# Patient Record
Sex: Male | Born: 1980 | Race: White | Hispanic: No | Marital: Married | Smoking: Former smoker
Health system: Southern US, Community
[De-identification: ages and names within clinical notes are randomized; demographics above are authoritative.]

## PROBLEM LIST (undated history)

## (undated) DIAGNOSIS — I10 Essential (primary) hypertension: Secondary | ICD-10-CM

## (undated) DIAGNOSIS — R002 Palpitations: Secondary | ICD-10-CM

## (undated) DIAGNOSIS — N200 Calculus of kidney: Secondary | ICD-10-CM

---

## 2011-04-12 ENCOUNTER — Emergency Department (HOSPITAL_COMMUNITY)
Admission: EM | Admit: 2011-04-12 | Discharge: 2011-04-12 | Disposition: A | Discharge status: Home / Self Care | Payer: Self-pay | Attending: Emergency Medicine | Admitting: Emergency Medicine

## 2011-04-12 DIAGNOSIS — N200 Calculus of kidney: Secondary | ICD-10-CM | POA: Insufficient documentation

## 2011-04-12 DIAGNOSIS — R109 Unspecified abdominal pain: Secondary | ICD-10-CM | POA: Insufficient documentation

## 2011-04-12 DIAGNOSIS — R110 Nausea: Secondary | ICD-10-CM | POA: Insufficient documentation

## 2011-04-12 LAB — URINE MICROSCOPIC-ADD ON

## 2011-04-12 LAB — DIFFERENTIAL
Basophils Absolute: 0 10*3/uL (ref 0.0–0.1)
Lymphocytes Relative: 12 % (ref 12–46)
Neutro Abs: 13.1 10*3/uL — ABNORMAL HIGH (ref 1.7–7.7)
Neutrophils Relative %: 82 % — ABNORMAL HIGH (ref 43–77)

## 2011-04-12 LAB — CBC
HCT: 43 % (ref 39.0–52.0)
Hemoglobin: 14.9 g/dL (ref 13.0–17.0)
RBC: 4.72 MIL/uL (ref 4.22–5.81)
WBC: 16 10*3/uL — ABNORMAL HIGH (ref 4.0–10.5)

## 2011-04-12 LAB — URINALYSIS, ROUTINE W REFLEX MICROSCOPIC
Bilirubin Urine: NEGATIVE
Glucose, UA: NEGATIVE mg/dL
Ketones, ur: NEGATIVE mg/dL
Nitrite: NEGATIVE
pH: 5.5 (ref 5.0–8.0)

## 2011-04-12 LAB — COMPREHENSIVE METABOLIC PANEL
Albumin: 4.8 g/dL (ref 3.5–5.2)
Alkaline Phosphatase: 74 U/L (ref 39–117)
BUN: 16 mg/dL (ref 6–23)
Chloride: 102 mEq/L (ref 96–112)
GFR calc Af Amer: >60 mL/min (ref >60)
Glucose, Bld: 107 mg/dL — ABNORMAL HIGH (ref 70–99)
Potassium: 4 mEq/L (ref 3.5–5.1)
Total Bilirubin: 0.3 mg/dL (ref 0.3–1.2)

## 2011-04-14 LAB — URINE CULTURE: Culture: NO GROWTH

## 2011-09-26 ENCOUNTER — Encounter: Payer: Self-pay | Admitting: *Deleted

## 2011-09-26 ENCOUNTER — Emergency Department (HOSPITAL_BASED_OUTPATIENT_CLINIC_OR_DEPARTMENT_OTHER)
Admission: EM | Admit: 2011-09-26 | Discharge: 2011-09-26 | Disposition: A | Discharge status: Home / Self Care | Payer: Self-pay | Attending: Emergency Medicine | Admitting: Emergency Medicine

## 2011-09-26 ENCOUNTER — Emergency Department (INDEPENDENT_AMBULATORY_CARE_PROVIDER_SITE_OTHER): Payer: Self-pay

## 2011-09-26 DIAGNOSIS — B9789 Other viral agents as the cause of diseases classified elsewhere: Secondary | ICD-10-CM | POA: Insufficient documentation

## 2011-09-26 DIAGNOSIS — R05 Cough: Secondary | ICD-10-CM

## 2011-09-26 DIAGNOSIS — I10 Essential (primary) hypertension: Secondary | ICD-10-CM | POA: Insufficient documentation

## 2011-09-26 DIAGNOSIS — B349 Viral infection, unspecified: Secondary | ICD-10-CM

## 2011-09-26 DIAGNOSIS — R079 Chest pain, unspecified: Secondary | ICD-10-CM

## 2011-09-26 HISTORY — DX: Calculus of kidney: N20.0

## 2011-09-26 HISTORY — DX: Palpitations: R00.2

## 2011-09-26 HISTORY — DX: Essential (primary) hypertension: I10

## 2011-09-26 MED ORDER — ALBUTEROL SULFATE HFA 108 (90 BASE) MCG/ACT IN AERS
2.0000 | INHALATION_SPRAY | RESPIRATORY_TRACT | Status: DC | PRN
  Administered 2011-09-26: 2 via RESPIRATORY_TRACT
  Filled 2011-09-26: qty 6.7

## 2011-09-26 NOTE — ED Provider Notes (Signed)
History     CSN: [id number] Arrival date & time: 09/26/2011  9:22 AM   First MD Initiated Contact with Patient 09/26/11 1126      Chief Complaint  Patient presents with  . Cough    (Consider location/radiation/quality/duration/timing/severity/associated sxs/prior treatment) HPI Complains of cough onset 5 days ago with diffuse myalgias and fever. Patient states he was improving until 2 days ago when he began to cough up green mucus with a few drops of blood mixed in today . No other current. Treated with over-the-counter cold medicines with partial relief . Patient's children and wife with similar symptoms  Past Medical History  Diagnosis Date  . Kidney stones   . Palpitations   . Hypertension     History reviewed. No pertinent past surgical history.  No family history on file.  History  Substance Use Topics  . Smoking status: Former Games developer  . Smokeless tobacco: Not on file  . Alcohol Use: No      Review of Systems  Constitutional: Negative.   HENT: Negative.   Respiratory: Positive for cough.   Cardiovascular: Negative.   Gastrointestinal: Negative.   Musculoskeletal: Negative.   Skin: Negative.   Neurological: Negative.   Hematological: Negative.   Psychiatric/Behavioral: Negative.   All other systems reviewed and are negative.    Allergies  Review of patient's allergies indicates no known allergies.  Home Medications   Current Outpatient Rx  Name Route Sig Dispense Refill  . GUAIFENESIN ER 600 MG PO TB12 Oral Take 1,200 mg by mouth 2 (two) times daily.      Marland Kitchen OXYMETAZOLINE HCL 0.05 % NA SOLN Nasal Place 2 sprays into the nose 2 (two) times daily.        BP 146/78  Pulse 80  Temp(Src) 98.1 F (36.7 C) (Oral)  Resp 20  Ht 5\' 10"  (1.778 m)  Wt 226 lb (102.513 kg)  BMI 32.43 kg/m2  SpO2 100%  Physical Exam  Nursing note and vitals reviewed. Constitutional: He appears well-developed and well-nourished.  HENT:  Head: Normocephalic and  atraumatic.  Eyes: Conjunctivae are normal. Pupils are equal, round, and reactive to light.  Neck: Neck supple. No tracheal deviation present. No thyromegaly present.  Cardiovascular: Normal rate and regular rhythm.   No murmur heard. Pulmonary/Chest: Effort normal. No respiratory distress.       Occasional cough, diffuse scattered rhonchi  Abdominal: Soft. Bowel sounds are normal. He exhibits no distension. There is no tenderness.  Musculoskeletal: Normal range of motion. He exhibits no edema and no tenderness.  Neurological: He is alert. Coordination normal.  Skin: Skin is warm and dry. No rash noted.  Psychiatric: He has a normal mood and affect.    ED Course  Procedures (including critical care time)  Labs Reviewed - No data to display No results found.   No diagnosis found.  Results for orders placed during the hospital encounter of 04/12/11  URINALYSIS, ROUTINE W REFLEX MICROSCOPIC      Component Value Range   Color, Urine YELLOW  YELLOW    APPearance HAZY (*) CLEAR    Specific Gravity, Urine >1.030 (*) 1.005 - 1.030    pH 5.5  5.0 - 8.0    Glucose, UA NEGATIVE  NEGATIVE (mg/dL)   Hgb urine dipstick LARGE (*) NEGATIVE    Bilirubin Urine NEGATIVE  NEGATIVE    Ketones, ur NEGATIVE  NEGATIVE (mg/dL)   Protein, ur NEGATIVE  NEGATIVE (mg/dL)   Urobilinogen, UA 0.2  0.0 - 1.0 (mg/dL)  Nitrite NEGATIVE  NEGATIVE    Leukocytes, UA NEGATIVE  NEGATIVE   URINE MICROSCOPIC-ADD ON      Component Value Range   Squamous Epithelial / LPF RARE  RARE    WBC, UA 3-6  <3 (WBC/hpf)   RBC / HPF TOO NUMEROUS TO COUNT  <3 (RBC/hpf)   Bacteria, UA MANY (*) RARE   DIFFERENTIAL      Component Value Range   Neutrophils Relative 82 (*) 43 - 77 (%)   Neutro Abs 13.1 (*) 1.7 - 7.7 (K/uL)   Lymphocytes Relative 12  12 - 46 (%)   Lymphs Abs 1.9  0.7 - 4.0 (K/uL)   Monocytes Relative 5  3 - 12 (%)   Monocytes Absolute 0.8  0.1 - 1.0 (K/uL)   Eosinophils Relative 0  0 - 5 (%)   Eosinophils  Absolute 0.1  0.0 - 0.7 (K/uL)   Basophils Relative 0  0 - 1 (%)   Basophils Absolute 0.0  0.0 - 0.1 (K/uL)  CBC      Component Value Range   WBC 16.0 (*) 4.0 - 10.5 (K/uL)   RBC 4.72  4.22 - 5.81 (MIL/uL)   Hemoglobin 14.9  13.0 - 17.0 (g/dL)   HCT 40.9  81.1 - 91.4 (%)   MCV 91.1  78.0 - 100.0 (fL)   MCH 31.6  26.0 - 34.0 (pg)   MCHC 34.7  30.0 - 36.0 (g/dL)   RDW 78.2  95.6 - 21.3 (%)   Platelets 225  150 - 400 (K/uL)  COMPREHENSIVE METABOLIC PANEL      Component Value Range   Sodium 140  135 - 145 (mEq/L)   Potassium 4.0  3.5 - 5.1 (mEq/L)   Chloride 102  96 - 112 (mEq/L)   CO2 28  19 - 32 (mEq/L)   Glucose, Bld 107 (*) 70 - 99 (mg/dL)   BUN 16  6 - 23 (mg/dL)   Creatinine, Ser 0.86  0.50 - 1.35 (mg/dL)   Calcium 9.9  8.4 - 57.8 (mg/dL)   Total Protein 8.0  6.0 - 8.3 (g/dL)   Albumin 4.8  3.5 - 5.2 (g/dL)   AST 14  0 - 37 (U/L)   ALT 15  0 - 53 (U/L)   Alkaline Phosphatase 74  39 - 117 (U/L)   Total Bilirubin 0.3  0.3 - 1.2 (mg/dL)   GFR calc non Af Amer >60  >60 (mL/min)   GFR calc Af Amer >60  >60 (mL/min)  URINE CULTURE      Component Value Range   Specimen Description URINE, CLEAN CATCH     Special Requests NONE     Setup Time [id number]     Colony Count NO GROWTH     Culture NO GROWTH     Report Status 04/14/2011 FINAL     Dg Chest 2 View  09/26/2011  *RADIOLOGY REPORT*  Clinical Data: Chest pain and productive cough.  CHEST - 2 VIEW  Comparison: None  Findings: The cardiac silhouette, mediastinal and hilar contours are within normal limits.  There is mild peribronchial thickening but no infiltrates, edema or effusions.  The bony thorax is intact.  IMPRESSION: Mild peribronchial thickening but no infiltrates, edema or effusions.  Original Report Authenticated By: P. Loralie Champagne, M.D.     MDM  Symptoms consistent with viral illness. Plan albuterol HFA to use 2 puffs every 4 hours as needed Diagnosis viral illness        Faiga Stones,  MD 09/26/11 1252

## 2011-09-26 NOTE — ED Notes (Signed)
Patient states he had flu like symptoms one week ago with bodyaches and fever.  Fever resolved, now has chest tightness and productive cough with clear / yellow secretions.

## 2012-01-03 ENCOUNTER — Encounter (HOSPITAL_BASED_OUTPATIENT_CLINIC_OR_DEPARTMENT_OTHER): Payer: Self-pay | Admitting: *Deleted

## 2012-01-03 ENCOUNTER — Emergency Department (HOSPITAL_BASED_OUTPATIENT_CLINIC_OR_DEPARTMENT_OTHER)
Admission: EM | Admit: 2012-01-03 | Discharge: 2012-01-04 | Disposition: A | Discharge status: Home / Self Care | Payer: Self-pay | Attending: Emergency Medicine | Admitting: Emergency Medicine

## 2012-01-03 DIAGNOSIS — Z87442 Personal history of urinary calculi: Secondary | ICD-10-CM | POA: Insufficient documentation

## 2012-01-03 DIAGNOSIS — I10 Essential (primary) hypertension: Secondary | ICD-10-CM | POA: Insufficient documentation

## 2012-01-03 DIAGNOSIS — N23 Unspecified renal colic: Secondary | ICD-10-CM | POA: Insufficient documentation

## 2012-01-03 DIAGNOSIS — R110 Nausea: Secondary | ICD-10-CM | POA: Insufficient documentation

## 2012-01-03 DIAGNOSIS — R109 Unspecified abdominal pain: Secondary | ICD-10-CM | POA: Insufficient documentation

## 2012-01-03 LAB — URINALYSIS, ROUTINE W REFLEX MICROSCOPIC
Leukocytes, UA: NEGATIVE
Specific Gravity, Urine: 1.025 (ref 1.005–1.030)
Urobilinogen, UA: 0.2 mg/dL (ref 0.0–1.0)

## 2012-01-03 LAB — URINE MICROSCOPIC-ADD ON

## 2012-01-03 MED ORDER — SODIUM CHLORIDE 0.9 % IV SOLN
INTRAVENOUS | Status: DC
  Administered 2012-01-03: 1000 mL via INTRAVENOUS

## 2012-01-03 MED ORDER — KETOROLAC TROMETHAMINE 15 MG/ML IJ SOLN
15.0000 mg | Freq: Once | INTRAMUSCULAR | Status: AC
  Administered 2012-01-03: 15 mg via INTRAVENOUS
  Filled 2012-01-03: qty 1

## 2012-01-03 MED ORDER — ONDANSETRON HCL 4 MG/2ML IJ SOLN
4.0000 mg | Freq: Once | INTRAMUSCULAR | Status: AC
  Administered 2012-01-03: 4 mg via INTRAVENOUS
  Filled 2012-01-03: qty 2

## 2012-01-03 MED ORDER — HYDROMORPHONE HCL PF 1 MG/ML IJ SOLN
1.0000 mg | Freq: Once | INTRAMUSCULAR | Status: AC
  Administered 2012-01-03: 1 mg via INTRAVENOUS
  Filled 2012-01-03: qty 1

## 2012-01-03 NOTE — ED Provider Notes (Signed)
History     CSN: [id number]  Arrival date & time 01/03/12  2104   First MD Initiated Contact with Patient 01/03/12 2337      Chief Complaint  Patient presents with  . Flank Pain    (Consider location/radiation/quality/duration/timing/severity/associated sxs/prior treatment) HPI This is a 31 year old white male with a long-standing history of kidney stones. He states he has 16 stones previously. He is here with 3 days of left flank pain that was mild and intermittent. It acutely worsened at about 7:30 this evening. It has been severe at its worst. It is not mitigated by anything. It was associated with nausea but no vomiting. The pain does not radiate. He states he has 7 known stones at this time. He is followed by Dr. Lindley Magnus of [facility] urology at [facility].  Past Medical History  Diagnosis Date  . Kidney stones   . Palpitations   . Hypertension     History reviewed. No pertinent past surgical history.  No family history on file.  History  Substance Use Topics  . Smoking status: Former Games developer  . Smokeless tobacco: Not on file  . Alcohol Use: No      Review of Systems  All other systems reviewed and are negative.    Allergies  Review of patient's allergies indicates no known allergies.  Home Medications   Current Outpatient Rx  Name Route Sig Dispense Refill  . ALBUTEROL SULFATE HFA 108 (90 BASE) MCG/ACT IN AERS Inhalation Inhale 2 puffs into the lungs every 6 (six) hours as needed. For shortness of breath    . IBUPROFEN 200 MG PO TABS Oral Take 600-800 mg by mouth once as needed. For pain    . NAPHAZOLINE HCL 0.012 % OP SOLN Both Eyes Place 1 drop into both eyes daily as needed. For dry eyes    . OXYCODONE-ACETAMINOPHEN 5-325 MG PO TABS Oral Take 1 tablet by mouth every 4 (four) hours as needed. For pain    . OXYMETAZOLINE HCL 0.05 % NA SOLN Nasal Place 2 sprays into the nose 2 (two) times daily.        BP 128/76  Pulse 72  Temp(Src) 98 F (36.7 C) (Oral)   Resp 16  SpO2 99%  Physical Exam General: Well-developed, well-nourished male in no acute distress; appearance consistent with age of record; uncomfortable appearing HENT: normocephalic, atraumatic Eyes: Normal per Neck: supple Heart: regular rate and rhythm Lungs: Normal respiratory effort and excursion Abdomen: soft; nondistended; nontender; bowel sounds present GU: Mild left CVA tenderness Extremities: No deformity; full range of motion Neurologic: Awake, alert and oriented; motor function intact in all extremities and symmetric; no facial droop Skin: Warm and dry    ED Course  Procedures (including critical care time)     MDM   Nursing notes and vitals signs, including pulse oximetry, reviewed.  Summary of this visit's results, reviewed by myself:  Labs:  Results for orders placed during the hospital encounter of 01/03/12  URINALYSIS, ROUTINE W REFLEX MICROSCOPIC      Component Value Range   Color, Urine YELLOW  YELLOW    APPearance CLEAR  CLEAR    Specific Gravity, Urine 1.025  1.005 - 1.030    pH 5.5  5.0 - 8.0    Glucose, UA NEGATIVE  NEGATIVE (mg/dL)   Hgb urine dipstick LARGE (*) NEGATIVE    Bilirubin Urine NEGATIVE  NEGATIVE    Ketones, ur NEGATIVE  NEGATIVE (mg/dL)   Protein, ur NEGATIVE  NEGATIVE (mg/dL)  Urobilinogen, UA 0.2  0.0 - 1.0 (mg/dL)   Nitrite NEGATIVE  NEGATIVE    Leukocytes, UA NEGATIVE  NEGATIVE   URINE MICROSCOPIC-ADD ON      Component Value Range   Squamous Epithelial / LPF RARE  RARE    RBC / HPF TOO NUMEROUS TO COUNT  <3 (RBC/hpf)   Bacteria, UA RARE  RARE    Urine-Other MUCOUS PRESENT     11:46 PM As the patient has had multiple CT scans and it is known that he has multiple stones we will defer CT scan at this time to spare the patient additional radiation exposure.  1:01 AM Patient resting comfortably. He will followup with his urologist in [facility].      Hanley Seamen, MD 01/04/12 [phone]

## 2012-01-03 NOTE — ED Notes (Signed)
C/O left flank pain that started yesterday, nausea, hematuria. Hx of kidney stones

## 2012-01-04 MED ORDER — HYDROMORPHONE HCL 2 MG PO TABS: 2.0000 mg | ORAL_TABLET | ORAL | Status: AC | PRN

## 2012-01-04 MED ORDER — NAPROXEN SODIUM 220 MG PO TABS: ORAL_TABLET | ORAL | Status: AC

## 2012-01-04 MED ORDER — ONDANSETRON 8 MG PO TBDP: 8.0000 mg | ORAL_TABLET | Freq: Three times a day (TID) | ORAL | Status: AC | PRN

## 2012-01-04 NOTE — ED Notes (Signed)
Liter bag of NSS infused  2nd bag added at 125cc/hr

## 2012-01-04 NOTE — Discharge Instructions (Signed)
Ureteral Colic (Kidney Stones) Ureteral colic is the result of a condition when kidney stones form inside the kidney. Once kidney stones are formed they may move into the tube that connects the kidney with the bladder (ureter). If this occurs, this condition may cause pain (colic) in the ureter.  CAUSES  Pain is caused by stone movement in the ureter and the obstruction caused by the stone. SYMPTOMS  The pain comes and goes as the ureter contracts around the stone. The pain is usually intense, sharp, and stabbing in character. The location of the pain may move as the stone moves through the ureter. When the stone is near the kidney the pain is usually located in the back and radiates to the belly (abdomen). When the stone is ready to pass into the bladder the pain is often located in the lower abdomen on the side the stone is located. At this location, the symptoms may mimic those of a urinary tract infection with urinary frequency. Once the stone is located here it often passes into the bladder and the pain disappears completely. TREATMENT   Your caregiver will provide you with medicine for pain relief.   You may require specialized follow-up X-rays.   The absence of pain does not always mean that the stone has passed. It may have just stopped moving. If the urine remains completely obstructed, it can cause loss of kidney function or even complete destruction of the involved kidney. It is your responsibility and in your interest that X-rays and follow-ups as suggested by your caregiver are completed. Relief of pain without passage of the stone can be associated with severe damage to the kidney, including loss of kidney function on that side.   If your stone does not pass on its own, additional measures may be taken by your caregiver to ensure its removal.  HOME CARE INSTRUCTIONS   Increase your fluid intake. Water is the preferred fluid since juices containing vitamin C may acidify the urine making  it less likely for certain stones (uric acid stones) to pass.   Strain all urine. A strainer will be provided. Keep all particulate matter or stones for your caregiver to inspect.   Take your pain medicine as directed.   Make a follow-up appointment with your caregiver as directed.   Remember that the goal is passage of your stone. The absence of pain does not mean the stone is gone. Follow your caregiver's instructions.   Only take over-the-counter or prescription medicines for pain, discomfort, or fever as directed by your caregiver.  SEEK MEDICAL CARE IF:   Pain cannot be controlled with the prescribed medicine.   You have a fever.   Pain continues for longer than your caregiver advises it should.   There is a change in the pain, and you develop chest discomfort or constant abdominal pain.   You feel faint or pass out.  MAKE SURE YOU:   Understand these instructions.   Will watch your condition.   Will get help right away if you are not doing well or get worse.  Document Released: 07/13/2005 Document Revised: 09/22/2011 Document Reviewed: 03/30/2011 [employer] Patient Information 2012 [employer]. 

## 2015-07-13 ENCOUNTER — Emergency Department (HOSPITAL_BASED_OUTPATIENT_CLINIC_OR_DEPARTMENT_OTHER)
Admission: EM | Admit: 2015-07-13 | Discharge: 2015-07-13 | Disposition: A | Discharge status: Home / Self Care | Payer: Self-pay | Attending: Physician Assistant | Admitting: Physician Assistant

## 2015-07-13 ENCOUNTER — Emergency Department (HOSPITAL_BASED_OUTPATIENT_CLINIC_OR_DEPARTMENT_OTHER): Payer: Self-pay

## 2015-07-13 ENCOUNTER — Encounter (HOSPITAL_BASED_OUTPATIENT_CLINIC_OR_DEPARTMENT_OTHER): Payer: Self-pay | Admitting: Emergency Medicine

## 2015-07-13 DIAGNOSIS — W500XXA Accidental hit or strike by another person, initial encounter: Secondary | ICD-10-CM | POA: Insufficient documentation

## 2015-07-13 DIAGNOSIS — Y998 Other external cause status: Secondary | ICD-10-CM | POA: Insufficient documentation

## 2015-07-13 DIAGNOSIS — Z791 Long term (current) use of non-steroidal anti-inflammatories (NSAID): Secondary | ICD-10-CM | POA: Insufficient documentation

## 2015-07-13 DIAGNOSIS — Z79899 Other long term (current) drug therapy: Secondary | ICD-10-CM | POA: Insufficient documentation

## 2015-07-13 DIAGNOSIS — S20212A Contusion of left front wall of thorax, initial encounter: Secondary | ICD-10-CM | POA: Insufficient documentation

## 2015-07-13 DIAGNOSIS — Z87891 Personal history of nicotine dependence: Secondary | ICD-10-CM | POA: Insufficient documentation

## 2015-07-13 DIAGNOSIS — Y9289 Other specified places as the place of occurrence of the external cause: Secondary | ICD-10-CM | POA: Insufficient documentation

## 2015-07-13 DIAGNOSIS — Z87442 Personal history of urinary calculi: Secondary | ICD-10-CM | POA: Insufficient documentation

## 2015-07-13 DIAGNOSIS — Y9389 Activity, other specified: Secondary | ICD-10-CM | POA: Insufficient documentation

## 2015-07-13 DIAGNOSIS — I10 Essential (primary) hypertension: Secondary | ICD-10-CM | POA: Insufficient documentation

## 2015-07-13 MED ORDER — IBUPROFEN 800 MG PO TABS: 800.0000 mg | ORAL_TABLET | Freq: Three times a day (TID) | ORAL | Status: AC

## 2015-07-13 MED ORDER — IBUPROFEN 800 MG PO TABS
800.0000 mg | ORAL_TABLET | Freq: Once | ORAL | Status: AC
  Administered 2015-07-13: 800 mg via ORAL
  Filled 2015-07-13: qty 1

## 2015-07-13 NOTE — Discharge Instructions (Signed)

## 2015-07-13 NOTE — ED Provider Notes (Signed)
CSN: [id number]   Arrival date & time 07/13/15 1815  History  By signing my name below, I, Bethel Born, attest that this documentation has been prepared under the direction and in the presence of Courteney Randall An, MD. Electronically Signed: Bethel Born, ED Scribe. 07/13/2015. 7:51 PM.  Chief Complaint  Patient presents with  . Rib Injury    HPI The history is provided by the patient. No language interpreter was used.   Jason Jordan is a 34 y.o. male who presents to the Emergency Department complaining of constant left rib pain with onset 1 week ago. Pt states that his wife fell on him while attempting to stretch his back and he felt a "pop" in the chest. He rates the pain 9/10 in severity and describes it as aching.  The pain is worse with deep breathing. He has been using BC powder at home with insufficient pain relief (last dose was around 1:30 PM today).   Past Medical History  Diagnosis Date  . Kidney stones   . Palpitations   . Hypertension     History reviewed. No pertinent past surgical history.  History reviewed. No pertinent family history.  Social History  Substance Use Topics  . Smoking status: Former Games developer  . Smokeless tobacco: None  . Alcohol Use: No     Review of Systems  Constitutional: Negative for fever and chills.  Respiratory:       Left rib pain  Cardiovascular: Negative for chest pain.  Gastrointestinal: Negative for nausea and vomiting.  Neurological: Negative for weakness.  All other systems reviewed and are negative.  Home Medications   Prior to Admission medications   Medication Sig Start Date End Date Taking? Authorizing Provider  albuterol (PROVENTIL HFA;VENTOLIN HFA) 108 (90 BASE) MCG/ACT inhaler Inhale 2 puffs into the lungs every 6 (six) hours as needed. For shortness of breath    Historical Provider, MD  ibuprofen (ADVIL,MOTRIN) 200 MG tablet Take 600-800 mg by mouth once as needed. For pain    Historical Provider, MD   naphazoline (CLEAR EYES) 0.012 % ophthalmic solution Place 1 drop into both eyes daily as needed. For dry eyes    Historical Provider, MD  naproxen sodium (ALEVE) 220 MG tablet Take 2 tablets twice daily until stone passes. 01/04/12   John Molpus, MD  oxyCODONE-acetaminophen (PERCOCET) 5-325 MG per tablet Take 1 tablet by mouth every 4 (four) hours as needed. For pain    Historical Provider, MD  oxymetazoline (AFRIN) 0.05 % nasal spray Place 2 sprays into the nose 2 (two) times daily.      Historical Provider, MD    Allergies  Review of patient's allergies indicates no known allergies.  Triage Vitals: BP 144/88 mmHg  Pulse 74  Temp(Src) 98.7 F (37.1 C) (Oral)  Resp 16  Ht  (1.778 m)  Wt 175 lb (79.379 kg)  BMI 25.11 kg/m2  SpO2 100%  Physical Exam  Constitutional: He is oriented to person, place, and time. He appears well-developed and well-nourished.  HENT:  Head: Normocephalic and atraumatic.  Eyes: EOM are normal.  Neck: Normal range of motion.  Cardiovascular: Normal rate, regular rhythm, normal heart sounds and intact distal pulses.   Pulmonary/Chest: Effort normal and breath sounds normal. No respiratory distress.  Abdominal: Soft. He exhibits no distension. There is no tenderness.  Musculoskeletal: Normal range of motion.  mild TTP of the lower left-hand ribs in front, axillary, and back  Neurological: He is alert and oriented to person, place,  and time.  Skin: Skin is warm and dry.  Psychiatric: He has a normal mood and affect. Judgment normal.  Nursing note and vitals reviewed.   ED Course  Procedures   DIAGNOSTIC STUDIES: Oxygen Saturation is 100% on RA, normal by my interpretation.    COORDINATION OF CARE: 7:49 PM Discussed treatment plan which includes XR of the chest/left ribs with pt at bedside and pt agreed to plan.  Labs Reviewed - No data to display  Imaging Review Dg Ribs Unilateral W/chest Left  07/13/2015   CLINICAL DATA:  Status post fall  4 days ago with left rib pain  EXAM: LEFT RIBS AND CHEST - 3+ VIEW  COMPARISON:  September 26, 2011  FINDINGS: No fracture or other bone lesions are seen involving the ribs. There is no evidence of pneumothorax or pleural effusion. Both lungs are clear. Heart size and mediastinal contours are within normal limits.  IMPRESSION: Negative.   Electronically Signed   By: Sherian Rein M.D.   On: 07/13/2015 19:22     MDM   Final diagnoses:  None   Patient is a 34 year old male presenting with rib pain. Patient says that his wife fell on him while he was on the ground and that he's got pain in his ribs. He says it's essentially bad with deep breaths. We will have him take ibuprofen. His got no evidence of trauma on external exam, no evidence of fracture on x-ray. I do not suspect rib fracture because it is pain all the way around his ribs not just in one area discretely as  I would suspect with a rib fracture.  I personally performed the services described in this documentation, which was scribed in my presence. The recorded information has been reviewed and is accurate.    Courteney Randall An, MD 07/13/15 2008

## 2015-07-13 NOTE — ED Notes (Signed)
MD at bedside. 

## 2015-07-13 NOTE — ED Notes (Addendum)
Patient requested to speak with MD.  MD notified.

## 2015-07-13 NOTE — ED Notes (Signed)
Patient was verbally aggressive towards the MD.  He didn't believe the diagnoses and wanted something more than Ibuprofen.  He followed MD out of room and through the nursing station.  At this point, he was walked to discharge window, where he continued to walk past and refused to check out.

## 2015-07-13 NOTE — ED Notes (Signed)
Patient reports that he hurt his back a few years ago, but reports he had someone help him with his back pain that he had about a week ago and now his left ribs are hurting

## 2017-05-02 IMAGING — CR DG RIBS W/ CHEST 3+V*L*
3 series · 3 of 3 positions shown · non-contrast
Comparison: September 26, 2011

CLINICAL DATA: Status post fall 4 days ago with left rib pain

EXAM:
LEFT RIBS AND CHEST - 3+ VIEW

[w chest pa]
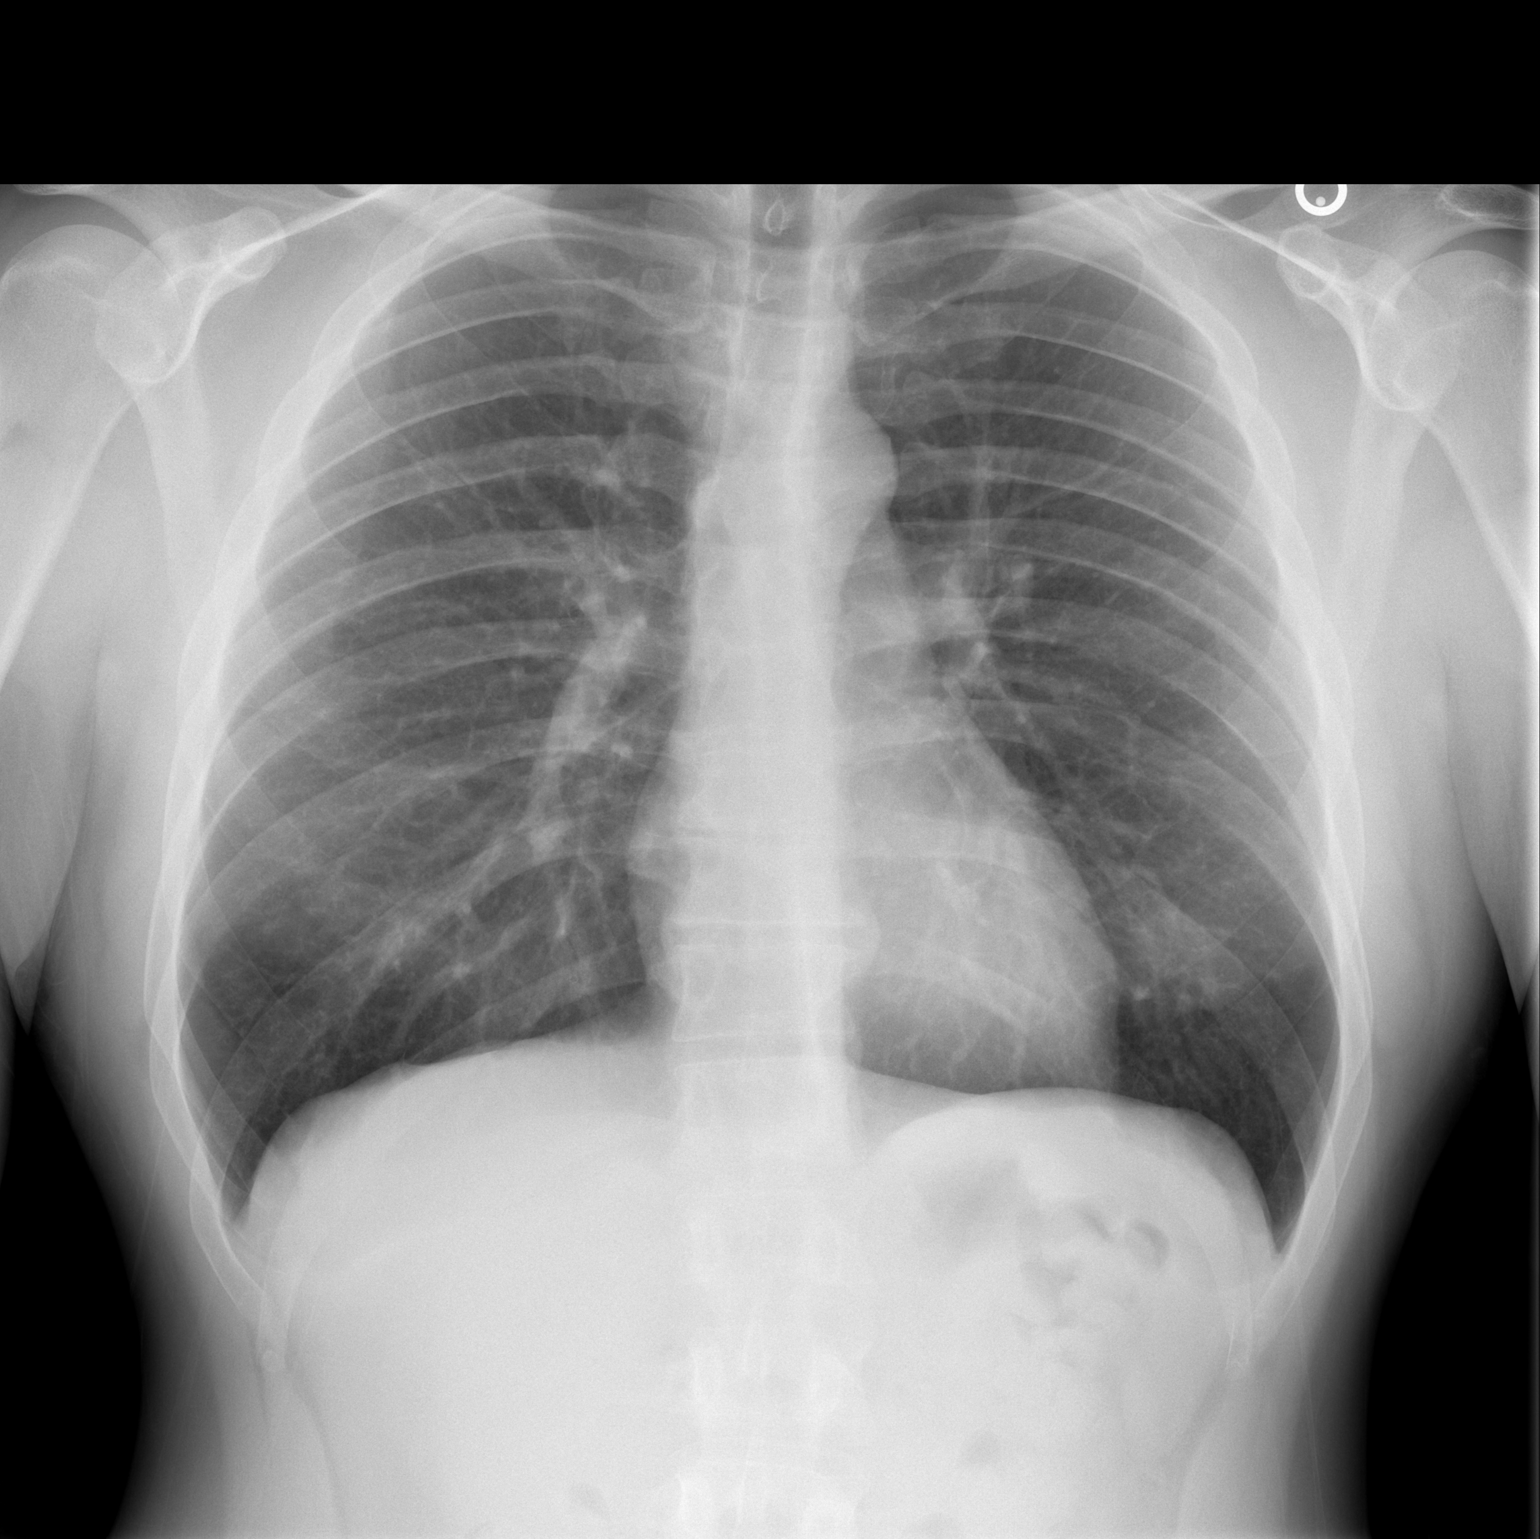

[w ribs ap/pa upper left]
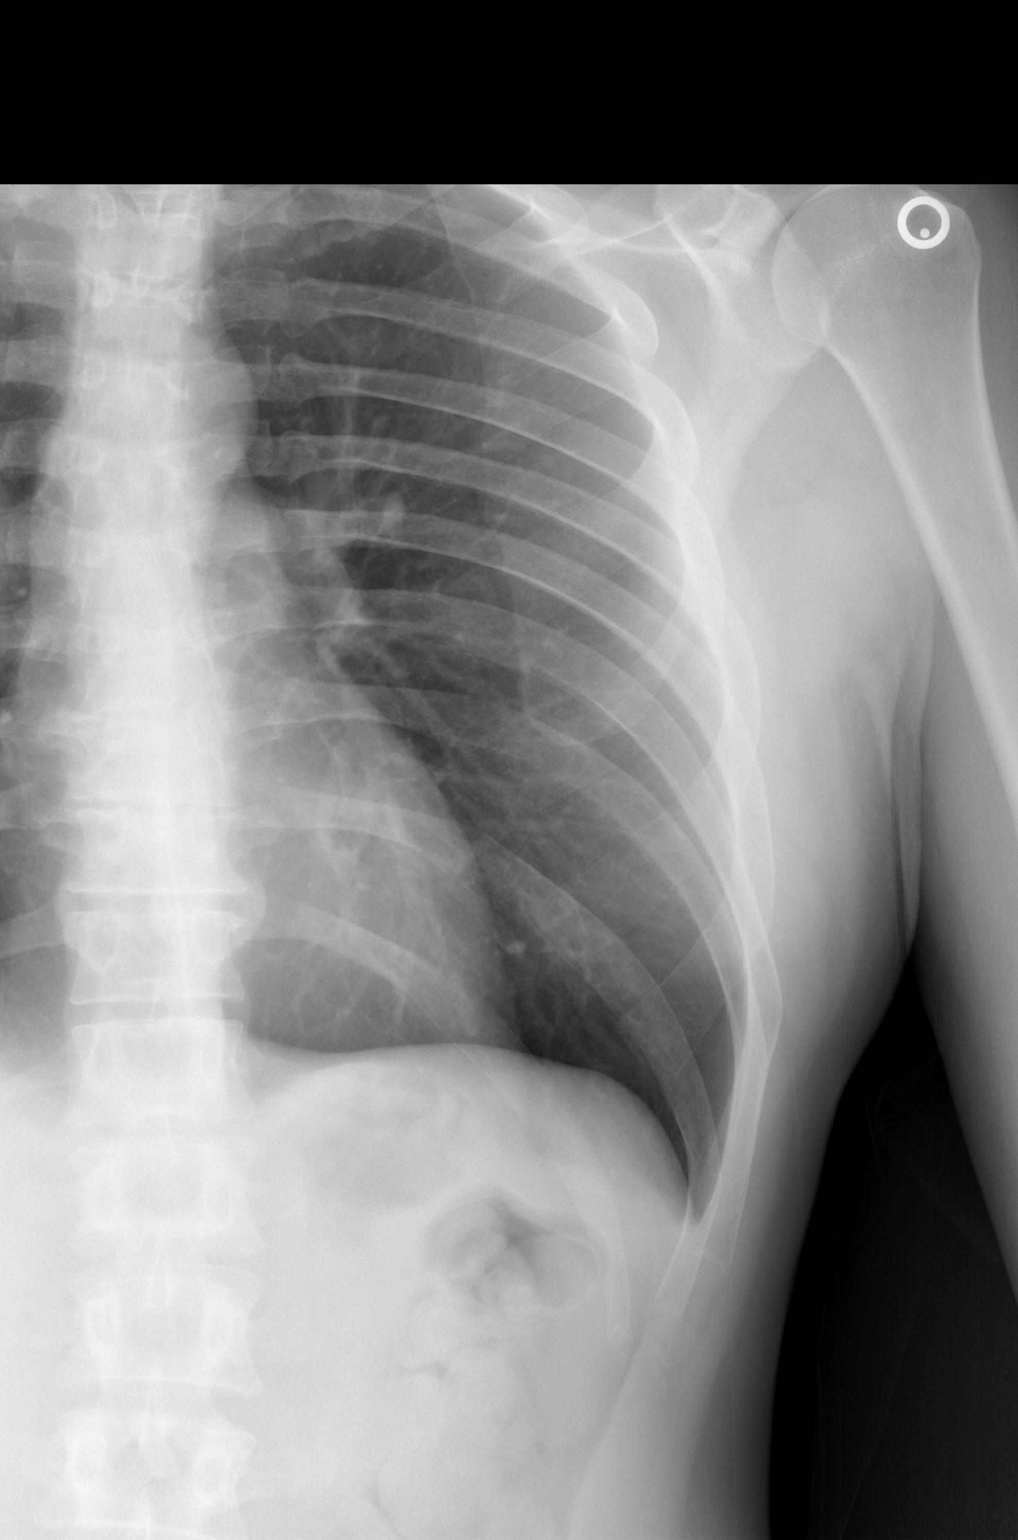

[w ribs oblique left]
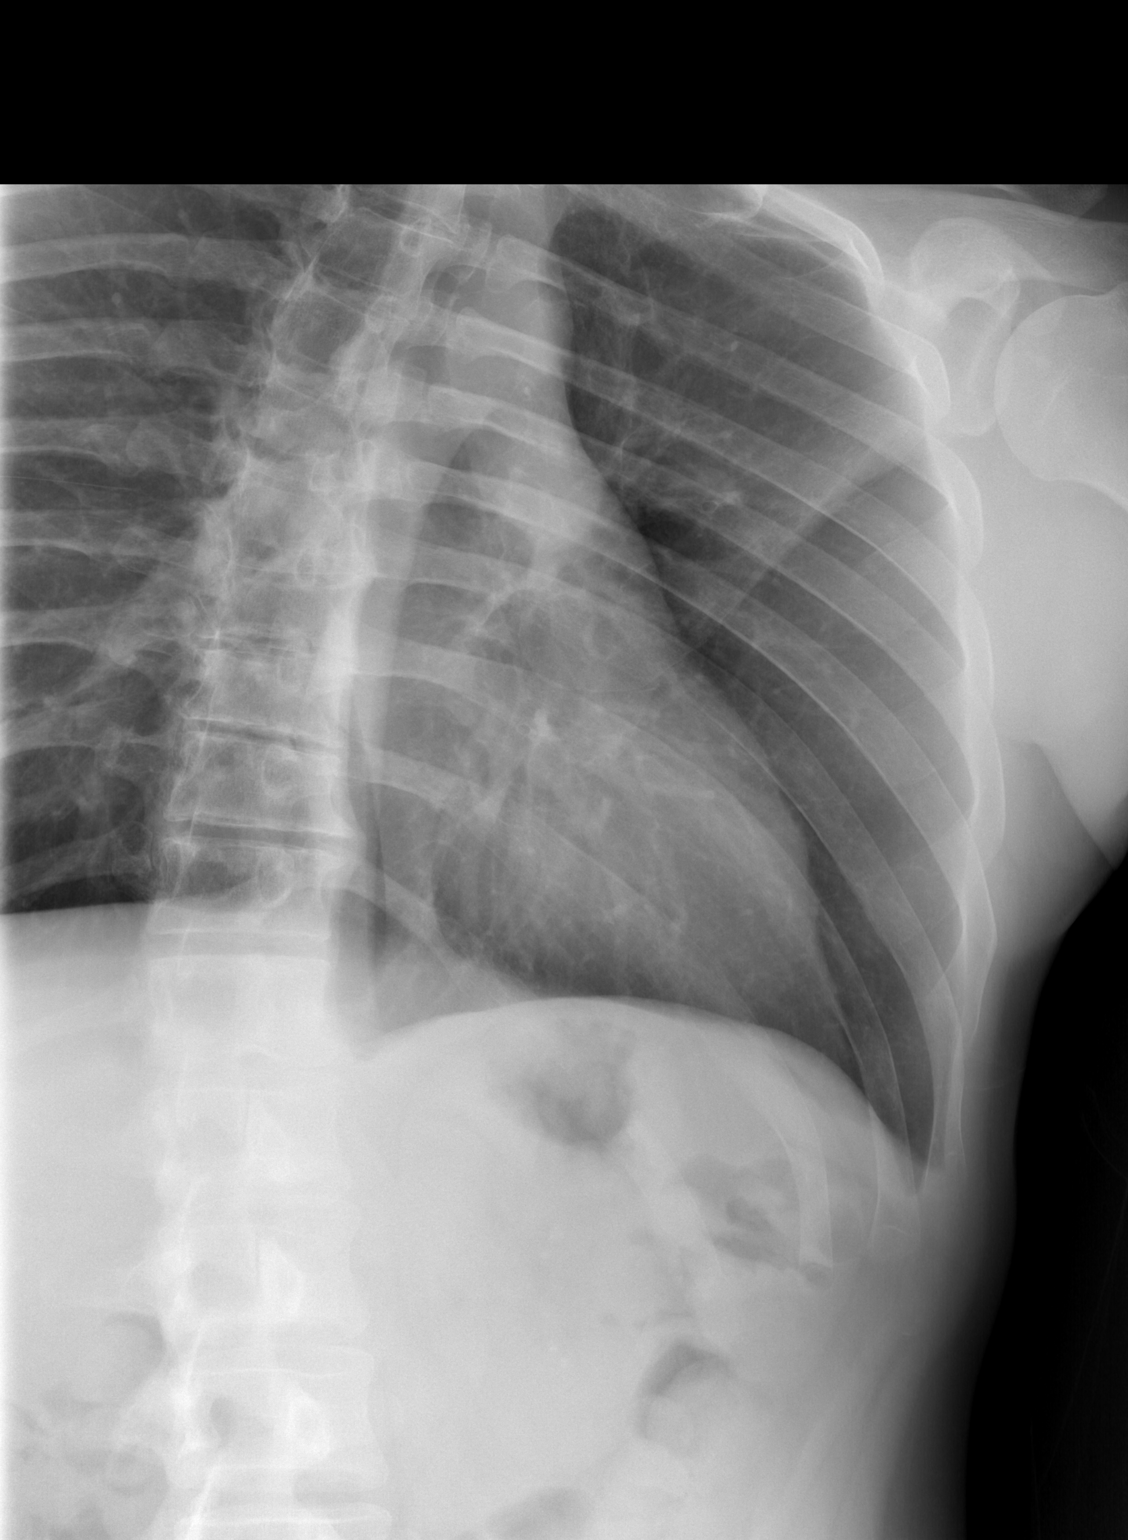

[3 of 3 positions shown; findings below may reference images not displayed]

FINDINGS: No fracture or other bone lesions are seen involving the ribs. There
is no evidence of pneumothorax or pleural effusion. Both lungs are
clear. Heart size and mediastinal contours are within normal limits.
IMPRESSION: Negative.

## 2022-09-14 ENCOUNTER — Ambulatory Visit
Admission: EM | Admit: 2022-09-14 | Discharge: 2022-09-14 | Disposition: A | Discharge status: Home / Self Care | Payer: BC Managed Care – PPO | Attending: Urgent Care | Admitting: Urgent Care

## 2022-09-14 DIAGNOSIS — J018 Other acute sinusitis: Secondary | ICD-10-CM

## 2022-09-14 DIAGNOSIS — I10 Essential (primary) hypertension: Secondary | ICD-10-CM | POA: Diagnosis not present

## 2022-09-14 DIAGNOSIS — R030 Elevated blood-pressure reading, without diagnosis of hypertension: Secondary | ICD-10-CM | POA: Diagnosis not present

## 2022-09-14 MED ORDER — CETIRIZINE HCL 10 MG PO TABS: 10.0000 mg | ORAL_TABLET | Freq: Every day | ORAL | 0 refills | Status: AC

## 2022-09-14 MED ORDER — DOXYCYCLINE HYCLATE 100 MG PO CAPS: 100.0000 mg | ORAL_CAPSULE | Freq: Two times a day (BID) | ORAL | 0 refills | Status: AC

## 2022-09-14 MED ORDER — PROMETHAZINE-DM 6.25-15 MG/5ML PO SYRP: 5.0000 mL | ORAL_SOLUTION | Freq: Three times a day (TID) | ORAL | 0 refills | Status: AC | PRN

## 2022-09-14 MED ORDER — IPRATROPIUM BROMIDE 0.03 % NA SOLN: 2.0000 | Freq: Two times a day (BID) | NASAL | 0 refills | Status: AC

## 2022-09-14 NOTE — ED Triage Notes (Signed)
Pt reports sinus pressure and pain x 1 weeks Pt reports bilateral ear pain. Pt reports running a fever earlier today.

## 2022-09-14 NOTE — ED Provider Notes (Signed)
[facility]  Note:  This document was prepared using Conservation officer, historic buildings and may include unintentional dictation errors.  MRN: [id number] DOB: 05-25-1981  Subjective:   Jason Jordan is a 41 y.o. male presenting for several week history of persistent sinus pressure, sinus fullness, bilateral intermittent ear pain and fullness.  Patient states that the last few days he has had a lot of sinus pain and has progressed to fever and more congestion.  Has also been coughing and the drainage is substantially worse causing more persistent throat pain.  Has been using Afrin consistently this past week.  Has difficulty controlling his allergies.  No smoking.  No vaping, marijuana use.  Regarding his blood pressure, he is not taking anything currently for this.  States that it was well controlled despite taking blood pressure medication previously.  No current facility-administered medications for this encounter.  Current Outpatient Medications:    albuterol (PROVENTIL HFA;VENTOLIN HFA) 108 (90 BASE) MCG/ACT inhaler, Inhale 2 puffs into the lungs every 6 (six) hours as needed. For shortness of breath, Disp: , Rfl:    ibuprofen (ADVIL,MOTRIN) 200 MG tablet, Take 600-800 mg by mouth once as needed. For pain, Disp: , Rfl:    ibuprofen (ADVIL,MOTRIN) 800 MG tablet, Take 1 tablet (800 mg total) by mouth 3 (three) times daily., Disp: 21 tablet, Rfl: 0   naphazoline (CLEAR EYES) 0.012 % ophthalmic solution, Place 1 drop into both eyes daily as needed. For dry eyes, Disp: , Rfl:    naproxen sodium (ALEVE) 220 MG tablet, Take 2 tablets twice daily until stone passes., Disp: , Rfl:    oxyCODONE-acetaminophen (PERCOCET) 5-325 MG per tablet, Take 1 tablet by mouth every 4 (four) hours as needed. For pain, Disp: , Rfl:    oxymetazoline (AFRIN) 0.05 % nasal spray, Place 2 sprays into the nose 2 (two) times daily.  , Disp: , Rfl:    No Known Allergies  Past Medical History:   Diagnosis Date   Hypertension    Kidney stones    Palpitations      History reviewed. No pertinent surgical history.  History reviewed. No pertinent family history.  Social History   Tobacco Use   Smoking status: Former  Substance Use Topics   Alcohol use: No   Drug use: No    ROS   Objective:   Vitals: BP (!) 160/120 (BP Location: Left Arm)   Pulse (!) 101   Temp 99.5 F (37.5 C) (Oral)   Resp 18   SpO2 100%   BP Readings from Last 3 Encounters:  09/14/22 (!) 160/120  07/13/15 140/82  01/04/12 112/57   BP recheck was 146/107.  Physical Exam Constitutional:      General: He is not in acute distress.    Appearance: Normal appearance. He is well-developed and normal weight. He is not ill-appearing, toxic-appearing or diaphoretic.  HENT:     Head: Normocephalic and atraumatic.     Right Ear: Tympanic membrane, ear canal and external ear normal. No drainage, swelling or tenderness. No middle ear effusion. There is no impacted cerumen. Tympanic membrane is not erythematous or bulging.     Left Ear: Tympanic membrane, ear canal and external ear normal. No drainage, swelling or tenderness.  No middle ear effusion. There is no impacted cerumen. Tympanic membrane is not erythematous or bulging.     Nose: Congestion and rhinorrhea present.     Mouth/Throat:     Mouth: Mucous membranes are moist.  Pharynx: Posterior oropharyngeal erythema (with significant post-nasal drainage) present. No oropharyngeal exudate.  Eyes:     General: No scleral icterus.       Right eye: No discharge.        Left eye: No discharge.     Extraocular Movements: Extraocular movements intact.     Conjunctiva/sclera: Conjunctivae normal.  Cardiovascular:     Rate and Rhythm: Normal rate and regular rhythm.     Heart sounds: Normal heart sounds. No murmur heard.    No friction rub. No gallop.  Pulmonary:     Effort: Pulmonary effort is normal. No respiratory distress.     Breath sounds:  Normal breath sounds. No stridor. No wheezing, rhonchi or rales.  Musculoskeletal:     Cervical back: Normal range of motion and neck supple. No rigidity. No muscular tenderness.  Neurological:     General: No focal deficit present.     Mental Status: He is alert and oriented to person, place, and time.  Psychiatric:        Mood and Affect: Mood normal.        Behavior: Behavior normal.        Thought Content: Thought content normal.     Assessment and Plan :   PDMP not reviewed this encounter.  1. Acute non-recurrent sinusitis of other sinus   2. Essential hypertension   3. Elevated blood pressure reading     Patient reported difficulty with amoxicillin and prefers to avoid this.  States that he thinks he will get swelling with it. Will start empiric treatment for sinusitis with doxycycline.  Recommended supportive care otherwise including the use of Zyrtec, Atrovent nasal spray, cough syrup. Deferred imaging given clear cardiopulmonary exam, hemodynamically stable vital signs. Counseled patient on potential for adverse effects with medications prescribed/recommended today, ER and return-to-clinic precautions discussed, patient verbalized understanding.    Wallis Bamberg, PA-C 09/14/22 [id number]
